# Patient Record
Sex: Male | Born: 1965 | Race: Black or African American | Hispanic: No | Marital: Single | Smoking: Former smoker
Health system: Southern US, Community
[De-identification: ages and names within clinical notes are randomized; demographics above are authoritative.]

---

## 2003-06-11 ENCOUNTER — Emergency Department (HOSPITAL_COMMUNITY): Admission: EM | Admit: 2003-06-11 | Discharge: 2003-06-11 | Payer: Self-pay

## 2012-04-03 ENCOUNTER — Emergency Department (HOSPITAL_COMMUNITY)
Admission: EM | Admit: 2012-04-03 | Discharge: 2012-04-03 | Disposition: A | Discharge status: Home / Self Care | Payer: Self-pay | Attending: Emergency Medicine | Admitting: Emergency Medicine

## 2012-04-03 ENCOUNTER — Encounter (HOSPITAL_COMMUNITY): Payer: Self-pay | Admitting: Emergency Medicine

## 2012-04-03 ENCOUNTER — Emergency Department (HOSPITAL_COMMUNITY): Payer: Self-pay

## 2012-04-03 DIAGNOSIS — M25579 Pain in unspecified ankle and joints of unspecified foot: Secondary | ICD-10-CM | POA: Insufficient documentation

## 2012-04-03 DIAGNOSIS — M79673 Pain in unspecified foot: Secondary | ICD-10-CM

## 2012-04-03 DIAGNOSIS — M7989 Other specified soft tissue disorders: Secondary | ICD-10-CM | POA: Insufficient documentation

## 2012-04-03 MED ORDER — NAPROXEN 500 MG PO TABS: 500.0000 mg | ORAL_TABLET | Freq: Two times a day (BID) | ORAL | Status: DC

## 2012-04-03 MED ORDER — OXYCODONE-ACETAMINOPHEN 5-325 MG PO TABS
1.0000 | ORAL_TABLET | Freq: Once | ORAL | Status: AC
  Administered 2012-04-03: 1 via ORAL
  Filled 2012-04-03: qty 1

## 2012-04-03 MED ORDER — OXYCODONE-ACETAMINOPHEN 5-325 MG PO TABS: 1.0000 | ORAL_TABLET | Freq: Four times a day (QID) | ORAL | Status: AC | PRN

## 2012-04-03 NOTE — ED Notes (Signed)
Pt presenting to ed with c/o left foot pain pt denies injury. Pt states positive swelling. Pt states he woke up yesterday with the pain and swelling and it hasn't stopped.

## 2012-04-03 NOTE — ED Provider Notes (Signed)
History     CSN: [id number]  Arrival date & time 04/03/12  1648   First MD Initiated Contact with Patient 04/03/12 1759      Chief Complaint  Patient presents with  . Foot Pain    (Consider location/radiation/quality/duration/timing/severity/associated sxs/prior treatment) HPI Comments: Patient presents with left foot pain in the area of the MTP joint since yesterday morning. Patient denies injury to the area. He complains of pain and swelling. He denies having swelling like this before. The patient states that he does not have a history of gout. He admits to eating a lot of red meat and drinking alcohol over the previous holiday weekend. No fevers or chills. Patient has been taking Aleve without relief of symptoms. Onset was gradual, course is gradually worsening. Walking or movement of the toe makes the pain worse. Nothing makes symptoms better. Onset was acute, course is gradually worsening. Walking makes the symptoms worse. Nothing makes the symptoms better.  Patient is a 46 y.o. male presenting with lower extremity pain. The history is provided by the patient.  Foot Pain This is a new problem. The current episode started yesterday. The problem occurs constantly. The problem has been gradually worsening. Associated symptoms include arthralgias. Pertinent negatives include no abdominal pain, chest pain, coughing, fever, headaches, myalgias, nausea, rash, sore throat or vomiting. The symptoms are aggravated by walking. He has tried NSAIDs for the symptoms. The treatment provided no relief.    History reviewed. No pertinent past medical history.  History reviewed. No pertinent past surgical history.  No family history on file.  History  Substance Use Topics  . Smoking status: Former Games developer  . Smokeless tobacco: Not on file  . Alcohol Use: 3.6 oz/week    6 Cans of beer per week     daily      Review of Systems  Constitutional: Negative for fever.  HENT: Negative for sore  throat and rhinorrhea.   Eyes: Negative for redness.  Respiratory: Negative for cough.   Cardiovascular: Negative for chest pain.  Gastrointestinal: Negative for nausea, vomiting, abdominal pain and diarrhea.  Musculoskeletal: Positive for arthralgias. Negative for myalgias.  Skin: Positive for color change. Negative for rash and wound.  Neurological: Negative for headaches.  Hematological: Negative for adenopathy.    Allergies  Review of patient's allergies indicates no known allergies.  Home Medications   Current Outpatient Rx  Name Route Sig Dispense Refill  . NAPROXEN SODIUM 220 MG PO TABS Oral Take 400 mg by mouth 2 (two) times daily as needed. pain      BP 103/62  Pulse 86  Temp 98.2 F (36.8 C) (Oral)  Resp 20  SpO2 99%  Physical Exam  Nursing note and vitals reviewed. Constitutional: He appears well-developed and well-nourished.  HENT:  Head: Normocephalic and atraumatic.  Eyes: Conjunctivae are normal.  Neck: Normal range of motion. Neck supple.  Cardiovascular: Normal pulses.   Musculoskeletal: He exhibits tenderness. He exhibits no edema.       Left ankle: Normal.       Left foot: He exhibits decreased range of motion and tenderness.       Feet:       Any motion of MTP of left foot causes significant pain.   Neurological: He is alert. No sensory deficit.       Motor, sensation, and vascular distal to the involved area is fully intact.   Skin: Skin is warm and dry.  Psychiatric: He has a normal mood and affect.  ED Course  Procedures (including critical care time)  Labs Reviewed - No data to display Dg Foot Complete Left  04/03/2012  *RADIOLOGY REPORT*  Clinical Data: Medial foot pain  LEFT FOOT - COMPLETE 3+ VIEW  Comparison: None.  Findings: Small avulsion fracture from the base of the proximal phalanx of the great toe on the lateral aspect has a subacute or chronic appearance.  No definite acute fracture.  Minimal spurring at the posterior calcaneus.   Mild degenerative changes in the midfoot.  IMPRESSION: Chronic changes as described.  Original Report Authenticated By: Donavan Burnet, M.D.     1. Foot pain     6:54 PM Patient seen and examined. Suspect gout, will treat. Crutches by ortho tech.   Vital signs reviewed and are as follows: Filed Vitals:   04/03/12 1723  BP: 103/62  Pulse: 86  Temp: 98.2 F (36.8 C)  Resp: 20   6:54 PM Patient counseled on use of narcotic pain medications. Counseled not to combine these medications with others containing tylenol. Urged not to drink alcohol, drive, or perform any other activities that requires focus while taking these medications. The patient verbalizes understanding and agrees with the plan.    MDM  Pain of MTP, redness overlying. No systemic sx or risk factors for septic joint. Do not suspect overlying cellulitis. Given recent history and exam findings, most likely gout. Urged PCP f/u for confirmation.        Renne Crigler, [place] 04/06/12 1401

## 2012-04-07 NOTE — ED Provider Notes (Signed)
Medical screening examination/treatment/procedure(s) were performed by non-physician practitioner and as supervising physician I was immediately available for consultation/collaboration.    Trae Bovenzi R Ifrah Vest, MD 04/07/12 1109 

## 2012-11-19 ENCOUNTER — Emergency Department (HOSPITAL_COMMUNITY)
Admission: EM | Admit: 2012-11-19 | Discharge: 2012-11-19 | Disposition: A | Discharge status: Home / Self Care | Payer: Self-pay | Attending: Emergency Medicine | Admitting: Emergency Medicine

## 2012-11-19 ENCOUNTER — Emergency Department (HOSPITAL_COMMUNITY): Payer: Self-pay

## 2012-11-19 ENCOUNTER — Encounter (HOSPITAL_COMMUNITY): Payer: Self-pay | Admitting: Emergency Medicine

## 2012-11-19 DIAGNOSIS — W010XXA Fall on same level from slipping, tripping and stumbling without subsequent striking against object, initial encounter: Secondary | ICD-10-CM | POA: Insufficient documentation

## 2012-11-19 DIAGNOSIS — S60229A Contusion of unspecified hand, initial encounter: Secondary | ICD-10-CM | POA: Insufficient documentation

## 2012-11-19 DIAGNOSIS — Z87891 Personal history of nicotine dependence: Secondary | ICD-10-CM | POA: Insufficient documentation

## 2012-11-19 DIAGNOSIS — S62309A Unspecified fracture of unspecified metacarpal bone, initial encounter for closed fracture: Secondary | ICD-10-CM | POA: Insufficient documentation

## 2012-11-19 DIAGNOSIS — Y9289 Other specified places as the place of occurrence of the external cause: Secondary | ICD-10-CM | POA: Insufficient documentation

## 2012-11-19 DIAGNOSIS — Y93K9 Activity, other involving animal care: Secondary | ICD-10-CM | POA: Insufficient documentation

## 2012-11-19 MED ORDER — OXYCODONE-ACETAMINOPHEN 5-325 MG PO TABS
2.0000 | ORAL_TABLET | Freq: Once | ORAL | Status: AC
  Administered 2012-11-19: 2 via ORAL
  Filled 2012-11-19: qty 2

## 2012-11-19 MED ORDER — OXYCODONE-ACETAMINOPHEN 5-325 MG PO TABS
ORAL_TABLET | ORAL | Status: DC
Start: 2012-11-19 — End: 2013-02-11

## 2012-11-19 NOTE — ED Provider Notes (Signed)
History     CSN: [id number]  Arrival date & time 11/19/12  1000   First MD Initiated Contact with Patient 11/19/12 1007      Chief Complaint  Patient presents with  . Hand Injury    (Consider location/radiation/quality/duration/timing/severity/associated sxs/prior treatment) HPI  Jaime [name] is a 47 y.o. male complaining of pain to right hand status post slip and fall 2 days ago. Patient was walking his dog and he slipped on wet tile falling backwards impacting his right hand. There is swelling associated. The pain is severe but only when it is touched. It is on the lateral ulnar side of the hand. He denies any numbness or paresthesia he does endorse a reduced range of motion in digits 5 and 4 secondary to pain. Patient is right-hand dominant.   History reviewed. No pertinent past medical history.  No past surgical history on file.  No family history on file.  History  Substance Use Topics  . Smoking status: Former Games developer  . Smokeless tobacco: Not on file  . Alcohol Use: 3.6 oz/week    6 Cans of beer per week     Comment: daily      Review of Systems  Constitutional: Negative for fever.  Respiratory: Negative for shortness of breath.   Cardiovascular: Negative for chest pain.  Gastrointestinal: Negative for nausea, vomiting, abdominal pain and diarrhea.  Musculoskeletal: Positive for joint swelling and arthralgias.  All other systems reviewed and are negative.    Allergies  Review of patient's allergies indicates no known allergies.  Home Medications   Current Outpatient Rx  Name  Route  Sig  Dispense  Refill  . Phenylephrine-Ibuprofen (ADVIL CONGESTION RELIEF) 10-200 MG TABS   Oral   Take 2 tablets by mouth every 6 (six) hours as needed (for cold).           BP 133/72  Pulse 71  Temp(Src) 97.7 F (36.5 C) (Oral)  Resp 16  SpO2 100%  Physical Exam  Nursing note and vitals reviewed. Constitutional: He is oriented to person, place, and time. He  appears well-developed and well-nourished. No distress.  HENT:  Head: Normocephalic.  Eyes: Conjunctivae and EOM are normal.  Cardiovascular: Normal rate.   Pulmonary/Chest: Effort normal. No stridor.  Musculoskeletal: Normal range of motion.       Hands: Contusion and tenderness to palpation on the palmar side of the right fourth and fifth metacarpal. There is reduced range of motion in both flexion and extension of the fourth and fifth digits however distal sensation is intact and cap refill is less than 2 seconds.  Neurological: He is alert and oriented to person, place, and time.  Psychiatric: He has a normal mood and affect.    ED Course  Procedures (including critical care time)  Labs Reviewed - No data to display Dg Hand Complete Right  11/19/2012  *RADIOLOGY REPORT*  Clinical Data: Swelling at fourth and fifth metacarpals.  Pain. Trauma.  RIGHT HAND - COMPLETE 3+ VIEW  Comparison: None.  Findings: Oblique fractures of the shafts of the fourth and fifth metacarpals.  Mildly displaced.  No intra-articular extension. Overlying soft tissue swelling.  IMPRESSION: Fourth and fifth metacarpal shaft fractures.   Original Report Authenticated By: Jeronimo Greaves, M.D.      1. Metacarpal bone fracture, closed, initial encounter       MDM  Mildly displaced fracture of the right fourth and fifth metacarpals occurring approximately 48 hours ago. Patient is right-hand dominant. He is neurovascularly  intact. Patient will be placed in an ulnar gutter splint, given a sling and encouraged to follow with hand surgery.  Discussed case with attending who agrees with plan and stability to d/c to home.    Pt verbalized understanding and agrees with care plan. Outpatient follow-up and return precautions given.    Discharge Medication List as of 11/19/2012 11:46 AM    START taking these medications   Details  oxyCODONE-acetaminophen (PERCOCET/ROXICET) 5-325 MG per tablet 1 to 2 tabs PO q6hrs  PRN for  pain, Print                 Wynetta Emery, PA-C 11/19/12 1603

## 2012-11-19 NOTE — ED Provider Notes (Signed)
Medical screening examination/treatment/procedure(s) were performed by non-physician practitioner and as supervising physician I was immediately available for consultation/collaboration.   Gwyneth Sprout, MD 11/19/12 [phone]

## 2012-11-19 NOTE — ED Notes (Signed)
Pt states he was walking his dog, slipped and fell, and landed on his right hand 2 days ago.  Pt states that it has been swollen and hurting ever since.  States that it only hurts when you touch it.

## 2013-02-11 ENCOUNTER — Encounter (HOSPITAL_COMMUNITY): Payer: Self-pay | Admitting: *Deleted

## 2013-02-11 ENCOUNTER — Emergency Department (HOSPITAL_COMMUNITY): Payer: Self-pay

## 2013-02-11 ENCOUNTER — Emergency Department (HOSPITAL_COMMUNITY)
Admission: EM | Admit: 2013-02-11 | Discharge: 2013-02-11 | Disposition: A | Discharge status: Home / Self Care | Payer: Self-pay | Attending: Emergency Medicine | Admitting: Emergency Medicine

## 2013-02-11 DIAGNOSIS — S8990XA Unspecified injury of unspecified lower leg, initial encounter: Secondary | ICD-10-CM | POA: Insufficient documentation

## 2013-02-11 DIAGNOSIS — Z87891 Personal history of nicotine dependence: Secondary | ICD-10-CM | POA: Insufficient documentation

## 2013-02-11 DIAGNOSIS — S99922A Unspecified injury of left foot, initial encounter: Secondary | ICD-10-CM

## 2013-02-11 DIAGNOSIS — Y9389 Activity, other specified: Secondary | ICD-10-CM | POA: Insufficient documentation

## 2013-02-11 DIAGNOSIS — Y929 Unspecified place or not applicable: Secondary | ICD-10-CM | POA: Insufficient documentation

## 2013-02-11 DIAGNOSIS — IMO0002 Reserved for concepts with insufficient information to code with codable children: Secondary | ICD-10-CM | POA: Insufficient documentation

## 2013-02-11 DIAGNOSIS — S99929A Unspecified injury of unspecified foot, initial encounter: Secondary | ICD-10-CM | POA: Insufficient documentation

## 2013-02-11 MED ORDER — HYDROCODONE-ACETAMINOPHEN 5-325 MG PO TABS: 1.0000 | ORAL_TABLET | Freq: Four times a day (QID) | ORAL | Status: DC | PRN

## 2013-02-11 MED ORDER — IBUPROFEN 800 MG PO TABS: 800.0000 mg | ORAL_TABLET | Freq: Three times a day (TID) | ORAL | Status: DC

## 2013-02-11 NOTE — ED Notes (Signed)
Pt discharged.Vital signs stable and GCS 15 

## 2013-02-11 NOTE — ED Notes (Signed)
Pt reports hitting left big toe on something two days ago and still having pain and swelling .ambulatory at triage.

## 2013-02-11 NOTE — ED Provider Notes (Signed)
History    This chart was scribed for Jaime [name], non-physician practitioner working with Carleene Cooper III, MD by Leone Payor, ED Scribe. This patient was seen in room TR07C/TR07C and the patient's care was started at 1826.   CSN: [id number]  Arrival date & time 02/11/13  1826   First MD Initiated Contact with Patient 02/11/13 1837      Chief Complaint  Patient presents with  . Toe Pain     The history is provided by the patient. No language interpreter was used.    HPI Comments: Jaime [name] is a 47 y.o. male who presents to the Emergency Department complaining of constant, gradually worsening L great toe pain starting 2 days ago. Pt states he stubbed it on something 2 days ago but he began to feel increasing pain and swelling yesterday. He denies fever. States does recall hx of similar pain, was told it may be gout. No other injuries. Took ibuprofen with mild relief.     History reviewed. No pertinent past medical history.  History reviewed. No pertinent past surgical history.  History reviewed. No pertinent family history.  History  Substance Use Topics  . Smoking status: Former Games developer  . Smokeless tobacco: Not on file  . Alcohol Use: 3.6 oz/week    6 Cans of beer per week     Comment: daily      Review of Systems  Musculoskeletal: Positive for joint swelling and arthralgias.    Allergies  Review of patient's allergies indicates no known allergies.  Home Medications   Current Outpatient Rx  Name  Route  Sig  Dispense  Refill  . diphenhydrAMINE (BENADRYL) 25 mg capsule   Oral   Take 25 mg by mouth daily as needed for allergies.         Marland Kitchen ibuprofen (ADVIL,MOTRIN) 200 MG tablet   Oral   Take 400 mg by mouth every 6 (six) hours as needed for pain.           BP 134/76  Pulse 72  Temp(Src) 97.9 F (36.6 C) (Oral)  Resp 18  SpO2 99%  Physical Exam  Nursing note and vitals reviewed. Constitutional: He is oriented to person, place, and  time. He appears well-developed and well-nourished. No distress.  HENT:  Head: Normocephalic and atraumatic.  Eyes: EOM are normal.  Neck: Neck supple. No tracheal deviation present.  Cardiovascular: Normal rate.   Pulmonary/Chest: Effort normal. No respiratory distress.  Musculoskeletal: Normal range of motion.  Swelling over L great toe and first MTP joint over palmer surface of 1st metatarsal. Tender to palpation over first toe, MTP joint and metatarsal. Pain with ROM at both MTP and IP joint of L toe.   Neurological: He is alert and oriented to person, place, and time.  Skin: Skin is warm and dry.  Psychiatric: He has a normal mood and affect. His behavior is normal.    ED Course  Procedures (including critical care time)  DIAGNOSTIC STUDIES: Oxygen Saturation is 99% on room air, normal by my interpretation.    COORDINATION OF CARE: 6:53 PM Discussed treatment plan with pt at bedside and pt agreed to plan.   Labs Reviewed - No data to display Dg Foot Complete Left  02/11/2013   *RADIOLOGY REPORT*  Clinical Data: Kicked wall. Medial left foot and toe pain.  LEFT FOOT - COMPLETE 3+ VIEW  Comparison: 04/03/2012  Findings: No evidence of acute fracture or dislocation.  Tiny dorsal calcaneal spur again noted.  No  significant change compared to prior exam.  IMPRESSION: No acute findings.   Original Report Authenticated By: Myles Rosenthal, M.D.     1. Foot injury, left, initial encounter       MDM  Pt with swelling and tenderness over left MTP joint of the great toe. Xray negative. Exam most consistent with possible gout vs traumatic joint pain. He has no fever, asymptomatic otherwise, no other complaints. Doubt septic joint. Will treat with NSAIDs, norco, follow up. Given precautions to return if redness or worsening swelling, fever, malaise. Pt voiced understanding.    Filed Vitals:   02/11/13 1831  BP: 134/76  Pulse: 72  Temp: 97.9 F (36.6 C)  TempSrc: Oral  Resp: 18  SpO2:  99%    I personally performed the services described in this documentation, which was scribed in my presence. The recorded information has been reviewed and is accurate.   Lottie Mussel, PA-C 02/12/13 0013

## 2013-02-12 NOTE — ED Provider Notes (Signed)
Medical screening examination/treatment/procedure(s) were performed by non-physician practitioner and as supervising physician I was immediately available for consultation/collaboration.   Carleene Cooper III, MD 02/12/13 [phone]

## 2013-12-13 ENCOUNTER — Encounter (HOSPITAL_COMMUNITY): Payer: Self-pay | Admitting: Emergency Medicine

## 2013-12-13 ENCOUNTER — Emergency Department (HOSPITAL_COMMUNITY): Payer: Self-pay

## 2013-12-13 ENCOUNTER — Emergency Department (HOSPITAL_COMMUNITY)
Admission: EM | Admit: 2013-12-13 | Discharge: 2013-12-13 | Disposition: A | Discharge status: Home / Self Care | Payer: Self-pay | Attending: Emergency Medicine | Admitting: Emergency Medicine

## 2013-12-13 DIAGNOSIS — W2203XA Walked into furniture, initial encounter: Secondary | ICD-10-CM | POA: Insufficient documentation

## 2013-12-13 DIAGNOSIS — Y9301 Activity, walking, marching and hiking: Secondary | ICD-10-CM | POA: Insufficient documentation

## 2013-12-13 DIAGNOSIS — Z87891 Personal history of nicotine dependence: Secondary | ICD-10-CM | POA: Insufficient documentation

## 2013-12-13 DIAGNOSIS — S92919A Unspecified fracture of unspecified toe(s), initial encounter for closed fracture: Secondary | ICD-10-CM | POA: Insufficient documentation

## 2013-12-13 DIAGNOSIS — S92911A Unspecified fracture of right toe(s), initial encounter for closed fracture: Secondary | ICD-10-CM

## 2013-12-13 DIAGNOSIS — Y92009 Unspecified place in unspecified non-institutional (private) residence as the place of occurrence of the external cause: Secondary | ICD-10-CM | POA: Insufficient documentation

## 2013-12-13 MED ORDER — HYDROCODONE-ACETAMINOPHEN 5-325 MG PO TABS: 1.0000 | ORAL_TABLET | Freq: Four times a day (QID) | ORAL | Status: DC | PRN

## 2013-12-13 MED ORDER — IBUPROFEN 400 MG PO TABS
800.0000 mg | ORAL_TABLET | Freq: Once | ORAL | Status: AC
  Administered 2013-12-13: 800 mg via ORAL
  Filled 2013-12-13: qty 4

## 2013-12-13 NOTE — ED Notes (Signed)
Ortho tech paged for XL post-op shoe.

## 2013-12-13 NOTE — ED Provider Notes (Signed)
CSN: [id number]     Arrival date & time 12/13/13  0011 History   First MD Initiated Contact with Patient 12/13/13 0034     Chief Complaint  Patient presents with  . Toe Injury     (Consider location/radiation/quality/duration/timing/severity/associated sxs/prior Treatment) Patient is a 48 y.o. male presenting with toe pain. The history is provided by the patient. No language interpreter was used.  Toe Pain This is a new problem. The current episode started today. Associated symptoms include arthralgias and joint swelling. Pertinent negatives include no nausea, numbness, vomiting or weakness.  Pt is a 48 year old male who reports that he hit his toe on a new piece of furniture while walking through the living room. He reports swelling and tenderness to his 4th, left toe. No numbness or tingling. Ambulatory without any difficulties.   History reviewed. No pertinent past medical history. History reviewed. No pertinent past surgical history. No family history on file. History  Substance Use Topics  . Smoking status: Former Games developer  . Smokeless tobacco: Not on file  . Alcohol Use: 3.6 oz/week    6 Cans of beer per week     Comment: daily    Review of Systems  Gastrointestinal: Negative for nausea and vomiting.  Musculoskeletal: Positive for arthralgias and joint swelling. Negative for gait problem.  Neurological: Negative for weakness and numbness.  All other systems reviewed and are negative.      Allergies  Review of patient's allergies indicates no known allergies.  Home Medications   Current Outpatient Rx  Name  Route  Sig  Dispense  Refill  . diphenhydrAMINE (BENADRYL) 25 mg capsule   Oral   Take 25 mg by mouth daily as needed for allergies.         Marland Kitchen HYDROcodone-acetaminophen (NORCO) 5-325 MG per tablet   Oral   Take 1 tablet by mouth every 6 (six) hours as needed for pain.   20 tablet   0   . ibuprofen (ADVIL,MOTRIN) 200 MG tablet   Oral   Take 400 mg by  mouth every 6 (six) hours as needed for pain.         Marland Kitchen ibuprofen (ADVIL,MOTRIN) 800 MG tablet   Oral   Take 1 tablet (800 mg total) by mouth 3 (three) times daily.   21 tablet   0    BP 142/87  Pulse 71  Temp(Src) 97.8 F (36.6 C) (Oral)  Resp 14  Ht 6\' 2"  (1.88 m)  Wt 245 lb (111.131 kg)  BMI 31.44 kg/m2  SpO2 97% Physical Exam  Nursing note and vitals reviewed. Constitutional: He is oriented to person, place, and time. He appears well-developed and well-nourished. No distress.  Eyes: Conjunctivae and EOM are normal.  Cardiovascular: Normal rate, regular rhythm, normal heart sounds and intact distal pulses.   Pulmonary/Chest: Effort normal and breath sounds normal. No respiratory distress. He has no wheezes.  Musculoskeletal: Normal range of motion. He exhibits edema and tenderness.  Right 4th toe edema and TTP. No numbness or tingling. Limited ROM, painful. Slight limp on right foot when walking.   Neurological: He is alert and oriented to person, place, and time.  Skin: Skin is warm and dry.  Psychiatric: He has a normal mood and affect. His behavior is normal. Judgment and thought content normal.    ED Course  Procedures (including critical care time) Labs Review Labs Reviewed - No data to display Imaging Review No results found.   EKG Interpretation None  MDM   Final diagnoses:  Toe fracture, right     Left foot x-ray, non-displaced fracture of left fourth toe. Toes buddy taped and ortho shoe given. Discussed plan of care and pt agrees.     Irish EldersKelly Ac Colan, NP 12/20/13 1836

## 2013-12-13 NOTE — Discharge Instructions (Signed)
Buddy Taping of Toes We have taped your toes together to keep them from moving. This is called "buddy taping" since we used a part of your own body to keep the injured part still. We placed soft padding between your toes to keep them from rubbing against each other. Buddy taping will help with healing and to reduce pain. Keep your toes buddy taped together for as long as directed by your caregiver. HOME CARE INSTRUCTIONS   Raise your injured area above the level of your heart while sitting or lying down. Prop it up with pillows.  An ice pack used every twenty minutes, while awake, for the first one to two days may be helpful. Put ice in a plastic bag and put a towel between the bag and your skin.  Watch for signs that the taping is too tight. These signs may be:  Numbness of your taped toes.  Coolness of your taped toes.  Color change in the area beyond the tape.  Increased pain.  If you have any of these signs, loosen or rewrap the tape. If you need to loosen or rewrap the buddy tape, make sure you use the padding again. SEEK IMMEDIATE MEDICAL CARE IF:   You have worse pain, swelling, inflammation (soreness), drainage or bleeding after you rewrap the tape.  Any new problems occur. MAKE SURE YOU:   Understand these instructions.  Will watch your condition.  Will get help right away if you are not doing well or get worse. Document Released: 06/17/2004 Document Revised: 12/06/2011 Document Reviewed: 09/10/2008 [facility] Patient Information 2014 [place], [place]. RICE: Routine Care for Injuries The routine care of many injuries includes Rest, Ice, Compression, and Elevation (RICE). HOME CARE INSTRUCTIONS  Rest is needed to allow your body to heal. Routine activities can usually be resumed when comfortable. Injured tendons and bones can take up to 6 weeks to heal. Tendons are the cord-like structures that attach muscle to bone.  Ice following an injury helps keep the swelling down  and reduces pain.  Put ice in a plastic bag.  Place a towel between your skin and the bag.  Leave the ice on for 15-20 minutes, 03-04 times a day. Do this while awake, for the first 24 to 48 hours. After that, continue as directed by your caregiver.  Compression helps keep swelling down. It also gives support and helps with discomfort. If an elastic bandage has been applied, it should be removed and reapplied every 3 to 4 hours. It should not be applied tightly, but firmly enough to keep swelling down. Watch fingers or toes for swelling, bluish discoloration, coldness, numbness, or excessive pain. If any of these problems occur, remove the bandage and reapply loosely. Contact your caregiver if these problems continue.  Elevation helps reduce swelling and decreases pain. With extremities, such as the arms, hands, legs, and feet, the injured area should be placed near or above the level of the heart, if possible. SEEK IMMEDIATE MEDICAL CARE IF:  You have persistent pain and swelling.  You develop redness, numbness, or unexpected weakness.  Your symptoms are getting worse rather than improving after several days. These symptoms may indicate that further evaluation or further X-rays are needed. Sometimes, X-rays may not show a small broken bone (fracture) until 1 week or 10 days later. Make a follow-up appointment with your caregiver. Ask when your X-ray results will be ready. Make sure you get your X-ray results. Document Released: 12/26/2000 Document Revised: 12/06/2011 Document Reviewed: 02/12/2011 [employer]  Patient Information 2014 [employer] [place].   Hydrocodone for severe pain Ibuprofen for mild-moderate pain Elevate and apply ice to toe Wear orthotic shoe Buddy tape toes together

## 2013-12-13 NOTE — ED Notes (Addendum)
Pt reports he banged his toe on his furniture about thirty minutes ago, pt's fourth left toe is swollen and tender to the touch, pt report numbness to his injured toe and top of his left foot. Pt is A&O X4. Pt states he is unable to bare weight on his left foot because he has too much pain.

## 2013-12-13 NOTE — ED Notes (Signed)
Patient transported to X-ray 

## 2013-12-20 NOTE — ED Provider Notes (Signed)
Medical screening examination/treatment/procedure(s) were performed by non-physician practitioner and as supervising physician I was immediately available for consultation/collaboration.   Dione Boozeavid Breann Losano, MD 12/20/13 2300

## 2015-05-05 ENCOUNTER — Emergency Department (HOSPITAL_COMMUNITY)
Admission: EM | Admit: 2015-05-05 | Discharge: 2015-05-05 | Disposition: A | Discharge status: Home / Self Care | Payer: Self-pay | Attending: Emergency Medicine | Admitting: Emergency Medicine

## 2015-05-05 ENCOUNTER — Emergency Department (HOSPITAL_COMMUNITY): Payer: Self-pay

## 2015-05-05 ENCOUNTER — Encounter (HOSPITAL_COMMUNITY): Payer: Self-pay | Admitting: Emergency Medicine

## 2015-05-05 DIAGNOSIS — W228XXA Striking against or struck by other objects, initial encounter: Secondary | ICD-10-CM | POA: Insufficient documentation

## 2015-05-05 DIAGNOSIS — Z79899 Other long term (current) drug therapy: Secondary | ICD-10-CM | POA: Insufficient documentation

## 2015-05-05 DIAGNOSIS — S2241XA Multiple fractures of ribs, right side, initial encounter for closed fracture: Secondary | ICD-10-CM | POA: Insufficient documentation

## 2015-05-05 DIAGNOSIS — Y998 Other external cause status: Secondary | ICD-10-CM | POA: Insufficient documentation

## 2015-05-05 DIAGNOSIS — Y9289 Other specified places as the place of occurrence of the external cause: Secondary | ICD-10-CM | POA: Insufficient documentation

## 2015-05-05 DIAGNOSIS — Y9389 Activity, other specified: Secondary | ICD-10-CM | POA: Insufficient documentation

## 2015-05-05 DIAGNOSIS — Z87891 Personal history of nicotine dependence: Secondary | ICD-10-CM | POA: Insufficient documentation

## 2015-05-05 MED ORDER — TRAMADOL HCL 50 MG PO TABS
50.0000 mg | ORAL_TABLET | Freq: Once | ORAL | Status: AC
  Administered 2015-05-05: 50 mg via ORAL
  Filled 2015-05-05: qty 1

## 2015-05-05 MED ORDER — CYCLOBENZAPRINE HCL 10 MG PO TABS
5.0000 mg | ORAL_TABLET | Freq: Once | ORAL | Status: AC
  Administered 2015-05-05: 5 mg via ORAL
  Filled 2015-05-05: qty 1

## 2015-05-05 MED ORDER — CYCLOBENZAPRINE HCL 10 MG PO TABS: 10.0000 mg | ORAL_TABLET | Freq: Two times a day (BID) | ORAL | Status: DC | PRN

## 2015-05-05 MED ORDER — NAPROXEN 500 MG PO TABS: 500.0000 mg | ORAL_TABLET | Freq: Two times a day (BID) | ORAL | Status: DC

## 2015-05-05 NOTE — ED Notes (Signed)
Pt. Stated, i injured my eright rib area 2 weeks ago in a truck door.

## 2015-05-05 NOTE — Discharge Instructions (Signed)

## 2015-05-05 NOTE — ED Provider Notes (Signed)
CSN: [id number]     Arrival date & time 05/05/15  1519 History  This chart was scribed for non-physician practitioner, Dorena Dew. Neva Seat, PA-C working with Mancel Bale, MD by Gwenyth Ober, ED scribe. This patient was seen in room TR03C/TR03C and the patient's care was started at 4:22 PM    Chief Complaint  Patient presents with  . Rib Injury   The history is provided by the patient. No language interpreter was used.    HPI Comments: Jaime [name] is a 49 y.o. male with no chronic medical history who presents to the Emergency Department complaining of constant, moderate right rib pain that started 2 weeks ago. Pt notes some movement and a pulsating sensation in right ribs when he breaths deeply. He has tried icing the affected area with no relief. Pt reports onset of pain occurred when he was leaning on the cab of his truck, bending to reach something, and his rib was caught in the truck. He further aggravated his symptoms while lifting furniture within the last few days. Pt denies difficulty breathing, sleeping or moving. No chest pain, diaphoresis, nausea, vomiting.   History reviewed. No pertinent past medical history. History reviewed. No pertinent past surgical history. No family history on file. History  Substance Use Topics  . Smoking status: Former Games developer  . Smokeless tobacco: Not on file  . Alcohol Use: 3.6 oz/week    6 Cans of beer per week     Comment: daily    Review of Systems  Respiratory: Negative for shortness of breath.   Musculoskeletal: Positive for arthralgias.  All other systems reviewed and are negative.  Allergies  Review of patient's allergies indicates no known allergies.  Home Medications   Prior to Admission medications   Medication Sig Start Date End Date Taking? Authorizing Provider  cyclobenzaprine (FLEXERIL) 10 MG tablet Take 1 tablet (10 mg total) by mouth 2 (two) times daily as needed for muscle spasms. 05/05/15   Elienai Gailey Neva Seat, PA-C   diphenhydrAMINE (BENADRYL) 25 mg capsule Take 25 mg by mouth daily as needed for allergies.    Historical Provider, MD  HYDROcodone-acetaminophen (NORCO) 5-325 MG per tablet Take 1 tablet by mouth every 6 (six) hours as needed for pain. 02/11/13   Tatyana Kirichenko, PA-C  HYDROcodone-acetaminophen (NORCO/VICODIN) 5-325 MG per tablet Take 1-2 tablets by mouth every 6 (six) hours as needed. 12/13/13   Irish Elders, NP  ibuprofen (ADVIL,MOTRIN) 200 MG tablet Take 400 mg by mouth every 6 (six) hours as needed for pain.    Historical Provider, MD  ibuprofen (ADVIL,MOTRIN) 800 MG tablet Take 1 tablet (800 mg total) by mouth 3 (three) times daily. 02/11/13   Tatyana Kirichenko, PA-C  naproxen (NAPROSYN) 500 MG tablet Take 1 tablet (500 mg total) by mouth 2 (two) times daily. 05/05/15   Taryn Nave Neva Seat, PA-C   BP 108/70 mmHg  Pulse 83  Temp(Src) 98.3 F (36.8 C) (Oral)  Resp 17  Ht 6\' 2"  (1.88 m)  Wt 238 lb (107.956 kg)  BMI 30.54 kg/m2  SpO2 99% Physical Exam  Constitutional: He appears well-developed and well-nourished. No distress.  HENT:  Head: Normocephalic and atraumatic.  Eyes: Conjunctivae and EOM are normal.  Neck: Neck supple. No tracheal deviation present.  Cardiovascular: Normal rate.   Pulmonary/Chest: Effort normal. No respiratory distress. He has no decreased breath sounds. He has no wheezes. He has no rhonchi. He exhibits tenderness, bony tenderness, edema and swelling. He exhibits no crepitus, no deformity and no retraction.  Skin: Skin is warm and dry.  Psychiatric: He has a normal mood and affect. His behavior is normal.  Nursing note and vitals reviewed.   ED Course  Procedures   DIAGNOSTIC STUDIES: Oxygen Saturation is 99% on RA, normal by my interpretation.    COORDINATION OF CARE: 4:27 PM Discussed treatment plan with pt which includes an x-ray of his right rib. Pt agreed to plan.   Labs Review Labs Reviewed - No data to display  Imaging Review Dg Ribs  Unilateral W/chest Right  05/05/2015   CLINICAL DATA:  49 year old male with right lower anterior rib injury 2 weeks ago. Pain. Initial encounter.  EXAM: RIGHT RIBS AND CHEST - 3+ VIEW  COMPARISON:  None.  FINDINGS: Fracture of the anterior lateral aspect of the right eighth rib.  No pneumothorax.  Minimal curvature of the thoracic spine convex to the right.  Heart size within normal limits.  IMPRESSION: Anterior lateral right eighth rib fracture.  No pneumothorax.  Mild scoliosis thoracic spine convex to the right.   Electronically Signed   By: Lacy Duverney M.D.   On: 05/05/2015 16:45     EKG Interpretation None      MDM   Final diagnoses:  Fracture of eight ribs or more, closed, right, initial encounter    Pt given Peak Flow and advised how to use this and how often. Given rx for muscle relaxer and NSAIDs F/u with ortho. No SOB, difficulty breathing, orthopnea.  Medications  cyclobenzaprine (FLEXERIL) tablet 5 mg (not administered)  traMADol (ULTRAM) tablet 50 mg (not administered)    49 y.o.Jaime [name]'s evaluation in the Emergency Department is complete. It has been determined that no acute conditions requiring further emergency intervention are present at this time. The patient/guardian have been advised of the diagnosis and plan. We have discussed signs and symptoms that warrant return to the ED, such as changes or worsening in symptoms.  Vital signs are stable at discharge. Filed Vitals:   05/05/15 1534  BP: 108/70  Pulse: 83  Temp: 98.3 F (36.8 C)  Resp: 17    Patient/guardian has voiced understanding and agreed to follow-up with the PCP or specialist.   I personally performed the services described in this documentation, which was scribed in my presence. The recorded information has been reviewed and is accurate.    Marlon Pel, PA-C 05/05/15 1709  Mancel Bale, MD 05/07/15 [phone]

## 2016-01-26 ENCOUNTER — Emergency Department (HOSPITAL_COMMUNITY)
Admission: EM | Admit: 2016-01-26 | Discharge: 2016-01-26 | Disposition: A | Discharge status: Home / Self Care | Payer: Worker's Compensation | Attending: Emergency Medicine | Admitting: Emergency Medicine

## 2016-01-26 ENCOUNTER — Encounter (HOSPITAL_COMMUNITY): Payer: Self-pay | Admitting: *Deleted

## 2016-01-26 DIAGNOSIS — M545 Low back pain, unspecified: Secondary | ICD-10-CM

## 2016-01-26 DIAGNOSIS — Z791 Long term (current) use of non-steroidal anti-inflammatories (NSAID): Secondary | ICD-10-CM | POA: Diagnosis not present

## 2016-01-26 DIAGNOSIS — Y9389 Activity, other specified: Secondary | ICD-10-CM | POA: Insufficient documentation

## 2016-01-26 DIAGNOSIS — Y998 Other external cause status: Secondary | ICD-10-CM | POA: Insufficient documentation

## 2016-01-26 DIAGNOSIS — S3992XA Unspecified injury of lower back, initial encounter: Secondary | ICD-10-CM | POA: Diagnosis not present

## 2016-01-26 DIAGNOSIS — Y92410 Unspecified street and highway as the place of occurrence of the external cause: Secondary | ICD-10-CM | POA: Diagnosis not present

## 2016-01-26 DIAGNOSIS — S199XXA Unspecified injury of neck, initial encounter: Secondary | ICD-10-CM | POA: Diagnosis present

## 2016-01-26 DIAGNOSIS — M542 Cervicalgia: Secondary | ICD-10-CM

## 2016-01-26 DIAGNOSIS — Z87891 Personal history of nicotine dependence: Secondary | ICD-10-CM | POA: Diagnosis not present

## 2016-01-26 MED ORDER — KETOROLAC TROMETHAMINE 60 MG/2ML IM SOLN
60.0000 mg | Freq: Once | INTRAMUSCULAR | Status: AC
  Administered 2016-01-26: 60 mg via INTRAMUSCULAR
  Filled 2016-01-26: qty 2

## 2016-01-26 MED ORDER — METHOCARBAMOL 500 MG PO TABS: 500.0000 mg | ORAL_TABLET | Freq: Two times a day (BID) | ORAL | Status: DC

## 2016-01-26 MED ORDER — IBUPROFEN 800 MG PO TABS: 800.0000 mg | ORAL_TABLET | Freq: Three times a day (TID) | ORAL | Status: DC
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
## 2016-02-03 ENCOUNTER — Emergency Department (HOSPITAL_COMMUNITY)
Admission: EM | Admit: 2016-02-03 | Discharge: 2016-02-03 | Disposition: A | Discharge status: Home / Self Care | Payer: Worker's Compensation | Attending: Emergency Medicine | Admitting: Emergency Medicine

## 2016-02-03 ENCOUNTER — Emergency Department (HOSPITAL_COMMUNITY): Payer: Worker's Compensation

## 2016-02-03 ENCOUNTER — Encounter (HOSPITAL_COMMUNITY): Payer: Self-pay

## 2016-02-03 DIAGNOSIS — Y9389 Activity, other specified: Secondary | ICD-10-CM | POA: Diagnosis not present

## 2016-02-03 DIAGNOSIS — F0781 Postconcussional syndrome: Secondary | ICD-10-CM | POA: Diagnosis not present

## 2016-02-03 DIAGNOSIS — Y92410 Unspecified street and highway as the place of occurrence of the external cause: Secondary | ICD-10-CM | POA: Insufficient documentation

## 2016-02-03 DIAGNOSIS — S39012A Strain of muscle, fascia and tendon of lower back, initial encounter: Secondary | ICD-10-CM | POA: Diagnosis not present

## 2016-02-03 DIAGNOSIS — Z87891 Personal history of nicotine dependence: Secondary | ICD-10-CM | POA: Insufficient documentation

## 2016-02-03 DIAGNOSIS — Z79899 Other long term (current) drug therapy: Secondary | ICD-10-CM | POA: Insufficient documentation

## 2016-02-03 DIAGNOSIS — S161XXA Strain of muscle, fascia and tendon at neck level, initial encounter: Secondary | ICD-10-CM | POA: Insufficient documentation

## 2016-02-03 DIAGNOSIS — Y998 Other external cause status: Secondary | ICD-10-CM | POA: Diagnosis not present

## 2016-02-03 DIAGNOSIS — S3991XA Unspecified injury of abdomen, initial encounter: Secondary | ICD-10-CM | POA: Insufficient documentation

## 2016-02-03 DIAGNOSIS — S199XXA Unspecified injury of neck, initial encounter: Secondary | ICD-10-CM | POA: Diagnosis present

## 2016-02-03 MED ORDER — DEXAMETHASONE SODIUM PHOSPHATE 10 MG/ML IJ SOLN
10.0000 mg | Freq: Once | INTRAMUSCULAR | Status: AC
  Administered 2016-02-03: 10 mg via INTRAVENOUS
  Filled 2016-02-03: qty 1

## 2016-02-03 MED ORDER — KETOROLAC TROMETHAMINE 30 MG/ML IJ SOLN: 30.0000 mg | Freq: Once | INTRAMUSCULAR | Status: DC

## 2016-02-03 MED ORDER — KETOROLAC TROMETHAMINE 30 MG/ML IJ SOLN
30.0000 mg | Freq: Once | INTRAMUSCULAR | Status: AC
  Administered 2016-02-03: 30 mg via INTRAVENOUS
  Filled 2016-02-03: qty 1

## 2016-02-03 MED ORDER — HYDROCODONE-ACETAMINOPHEN 5-325 MG PO TABS: 1.0000 | ORAL_TABLET | ORAL | Status: DC | PRN

## 2016-02-03 MED ORDER — PREDNISONE 10 MG (21) PO TBPK: 10.0000 mg | ORAL_TABLET | Freq: Every day | ORAL | Status: DC

## 2016-02-03 MED ORDER — DEXAMETHASONE SODIUM PHOSPHATE 10 MG/ML IJ SOLN: 10.0000 mg | Freq: Once | INTRAMUSCULAR | Status: DC

## 2016-02-03 NOTE — ED Notes (Signed)
Pt is in stable condition upon d/c and ambulates from ED. 
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
## 2019-03-23 ENCOUNTER — Other Ambulatory Visit: Payer: Self-pay

## 2019-03-23 ENCOUNTER — Encounter (HOSPITAL_COMMUNITY): Payer: Self-pay | Admitting: Emergency Medicine

## 2019-03-23 ENCOUNTER — Emergency Department (HOSPITAL_COMMUNITY)
Admission: EM | Admit: 2019-03-23 | Discharge: 2019-03-23 | Disposition: A | Discharge status: Home / Self Care | Payer: Self-pay | Attending: Emergency Medicine | Admitting: Emergency Medicine

## 2019-03-23 ENCOUNTER — Emergency Department (HOSPITAL_COMMUNITY): Payer: Self-pay

## 2019-03-23 DIAGNOSIS — S43402A Unspecified sprain of left shoulder joint, initial encounter: Secondary | ICD-10-CM

## 2019-03-23 DIAGNOSIS — Z87891 Personal history of nicotine dependence: Secondary | ICD-10-CM | POA: Insufficient documentation

## 2019-03-23 DIAGNOSIS — M25512 Pain in left shoulder: Secondary | ICD-10-CM | POA: Insufficient documentation

## 2019-03-23 MED ORDER — NAPROXEN 500 MG PO TABS: 500.0000 mg | ORAL_TABLET | Freq: Two times a day (BID) | ORAL | 0 refills | Status: AC

## 2019-03-23 MED ORDER — METHOCARBAMOL 500 MG PO TABS: 1000.0000 mg | ORAL_TABLET | Freq: Four times a day (QID) | ORAL | 0 refills | Status: AC

## 2019-03-23 MED ORDER — IBUPROFEN 400 MG PO TABS
600.0000 mg | ORAL_TABLET | Freq: Once | ORAL | Status: AC
  Administered 2019-03-23: 600 mg via ORAL
  Filled 2019-03-23: qty 1

## 2019-03-23 MED ORDER — KETOROLAC TROMETHAMINE 30 MG/ML IJ SOLN: 15.0000 mg | Freq: Once | INTRAMUSCULAR | Status: DC

## 2019-03-23 NOTE — ED Notes (Signed)
Patient transported to X-ray 

## 2019-03-23 NOTE — ED Provider Notes (Signed)
[facility] Provider Note   CSN: [id number] Arrival date & time: 03/23/19  1421    History   Chief Complaint Chief Complaint  Patient presents with  . Shoulder Pain    HPI Jaime [name] is a 53 y.o. male who is previously healthy who presents with left shoulder pain after he fell 2 days ago.  Patient reports he was putting down a garage door when it hit him in the head and he fell on his left shoulder.  He has been unable to move it very much since.  He denies any numbness or tingling.  He is not taking any medications at home for symptoms.  He reports he has a little knot on his head, but denies any headache or loss of consciousness.  He denies any other injuries.  He denies any chest pain or shortness of breath.     HPI  History reviewed. No pertinent past medical history.  There are no active problems to display for this patient.   History reviewed. No pertinent surgical history.      Home Medications    Prior to Admission medications   Medication Sig Start Date End Date Taking? Authorizing Provider  diphenhydrAMINE (BENADRYL) 25 mg capsule Take 25 mg by mouth daily as needed for allergies.    [provider]  HYDROcodone-acetaminophen (NORCO/VICODIN) 5-325 MG tablet Take 1 tablet by mouth every 4 (four) hours as needed. 02/03/16   Jacalyn LefevreHaviland, Julie, MD  methocarbamol (ROBAXIN) 500 MG tablet Take 1 tablet (500 mg total) by mouth 2 (two) times daily. 01/26/16   Barrett, Rolm GalaStevi, PA-C  predniSONE (STERAPRED UNI-PAK 21 TAB) 10 MG (21) TBPK tablet Take 1 tablet (10 mg total) by mouth daily. Take 6 tabs by mouth daily  for 2 days, then 5 tabs for 2 days, then 4 tabs for 2 days, then 3 tabs for 2 days, 2 tabs for 2 days, then 1 tab by mouth daily for 2 days 02/03/16   Jacalyn LefevreHaviland, Julie, MD    Family History No family history on file.  Social History Social History   Tobacco Use  . Smoking status: Former Games developermoker  . Smokeless tobacco:  Never Used  Substance Use Topics  . Alcohol use: Not Currently    Alcohol/week: 6.0 standard drinks    Types: 6 Cans of beer per week    Comment: daily  . Drug use: No     Allergies   Patient has no known allergies.   Review of Systems Review of Systems  Constitutional: Negative for fever.  Respiratory: Negative for shortness of breath.   Cardiovascular: Negative for chest pain.  Musculoskeletal: Positive for arthralgias.  Neurological: Negative for syncope and headaches.     Physical Exam Updated Vital Signs BP 138/79 (BP Location: Right Arm)   Pulse 88   Temp 97.8 F (36.6 C) (Oral)   Resp 14   Ht 6\' 2"  (1.88 m)   Wt 108 kg   SpO2 98%   BMI 30.56 kg/m   Physical Exam Vitals signs and nursing note reviewed.  Constitutional:      General: He is not in acute distress.    Appearance: He is well-developed. He is not diaphoretic.  HENT:     Head: Normocephalic and atraumatic.     Mouth/Throat:     Pharynx: No oropharyngeal exudate.  Eyes:     General: No scleral icterus.       Right eye: No discharge.  Left eye: No discharge.     Conjunctiva/sclera: Conjunctivae normal.     Pupils: Pupils are equal, round, and reactive to light.  Neck:     Musculoskeletal: Normal range of motion and neck supple.     Thyroid: No thyromegaly.  Cardiovascular:     Rate and Rhythm: Normal rate and regular rhythm.     Heart sounds: Normal heart sounds. No murmur. No friction rub. No gallop.   Pulmonary:     Effort: Pulmonary effort is normal. No respiratory distress.     Breath sounds: Normal breath sounds. No stridor. No wheezing or rales.  Abdominal:     General: Bowel sounds are normal. There is no distension.     Palpations: Abdomen is soft.     Tenderness: There is no abdominal tenderness. There is no guarding or rebound.  Musculoskeletal:     Comments: Very limited range of motion of the left shoulder with anterior and posterior tenderness around the rotator cuff  and some bony tenderness anterior and posterior; limited passive range of motion as well; no significant edema, erythema or warmth; radial pulse intact; sensation intact; equal bilateral grip strength with 5/5 strength with limited, resisted flexion extension  Lymphadenopathy:     Cervical: No cervical adenopathy.  Skin:    General: Skin is warm and dry.     Coloration: Skin is not pale.     Findings: No rash.  Neurological:     Mental Status: He is alert.     Coordination: Coordination normal.      ED Treatments / Results  Labs (all labs ordered are listed, but only abnormal results are displayed) Labs Reviewed - No data to display  EKG None  Radiology No results found.  Procedures Procedures (including critical care time)  Medications Ordered in ED Medications  ketorolac (TORADOL) 30 MG/ML injection 15 mg (has no administration in time range)     Initial Impression / Assessment and Plan / ED Course  I have reviewed the triage vital signs and the nursing notes.  Pertinent labs & imaging results that were available during my care of the patient were reviewed by me and considered in my medical decision making (see chart for details).        Patient presenting with left shoulder pain following fall.  Suspect rotator cuff injury, but x-ray is pending at shift change.  Neurovascularly intact.  Patient given IM Toradol for pain while he is waiting.  No history of kidney disease or GI bleeding, per patient. At shift change, care transferred to [facility], PA- C for determination of disposition.    Final Clinical Impressions(s) / ED Diagnoses   Final diagnoses:  Acute pain of left shoulder    ED Discharge Orders    None       Frederica Kuster, PA-C 03/23/19 1555    Milton Ferguson, MD 03/27/19 1310

## 2019-03-23 NOTE — Discharge Instructions (Signed)
Please read and follow all provided instructions.  Your diagnoses today include:  1. Acute pain of left shoulder   2. Sprain of left shoulder, unspecified shoulder sprain type, initial encounter     Tests performed today include:  An x-ray of the affected area - does NOT show any broken bones  Vital signs. See below for your results today.   Medications prescribed:   Naproxen - anti-inflammatory pain medication  Do not exceed 500mg  naproxen every 12 hours, take with food  You have been prescribed an anti-inflammatory medication or NSAID. Take with food. Take smallest effective dose for the shortest duration needed for your pain. Stop taking if you experience stomach pain or vomiting.    Robaxin (methocarbamol) - muscle relaxer medication  DO NOT drive or perform any activities that require you to be awake and alert because this medicine can make you drowsy.   Take any prescribed medications only as directed.  Home care instructions:   Follow any educational materials contained in this packet  Follow R.I.C.E. Protocol:  R - rest your injury   I  - use ice on injury without applying directly to skin  C - compress injury with bandage or splint  E - elevate the injury as much as possible  Follow-up instructions: Please follow-up with your primary care provider or the provided orthopedic physician (bone specialist) if you continue to have significant pain in 1 week. In this case you may have a more severe injury that requires further care.   Return instructions:   Please return if your fingers are numb or tingling, appear gray or blue, or you have severe pain (also elevate the arm and loosen splint or wrap if you were given one)  Please return to the Emergency Department if you experience worsening symptoms.   Please return if you have any other emergent concerns.  Additional Information:  Your vital signs today were: BP 138/79 (BP Location: Right Arm)    Pulse 88     Temp 97.8 F (36.6 C) (Oral)    Resp 14    Ht 6\' 2"  (1.88 m)    Wt 108 kg    SpO2 98%    BMI 30.56 kg/m  If your blood pressure (BP) was elevated above 135/85 this visit, please have this repeated by your doctor within one month. --------------

## 2019-03-23 NOTE — ED Provider Notes (Signed)
4:30 PM Handoff from Law PA-C at shift change.  Patient pending x-ray for left shoulder pain after a fall 2 days ago.  X-rays are negative.  Patient likely has a left shoulder sprain.  Conservative treatment for 1 week, orthopedic follow-up recommended if not improving after that time.  Upper extremity is neurovascularly intact.  Patient is tender about the posterior lateral shoulder.  He is unable to lift above shoulder height.  BP 128/84 (BP Location: Right Arm)   Pulse 67   Temp 97.8 F (36.6 C) (Oral)   Resp 16   Ht 6\' 2"  (1.88 m)   Wt 108 kg   SpO2 99%   BMI 30.56 kg/m   Patient counseled on proper use of muscle relaxant medication.  They were told not to drink alcohol, drive any vehicle, or do any dangerous activities while taking this medication.  Patient verbalized understanding.    Carlisle Cater, PA-C 03/23/19 1630    Carmin Muskrat, MD 03/24/19 [phone]

## 2019-03-23 NOTE — ED Triage Notes (Signed)
Pt reports he fell on Wednesday and today he is coming in for left shoulder pain.

## 2019-10-29 IMAGING — CR LEFT SHOULDER - 2+ VIEW
3 series · 3 of 3 positions shown · non-contrast
Comparison: None.

CLINICAL DATA: Left shoulder pain after fall 2 days ago.

EXAM:
LEFT SHOULDER - 2+ VIEW

[shoulder grashey]
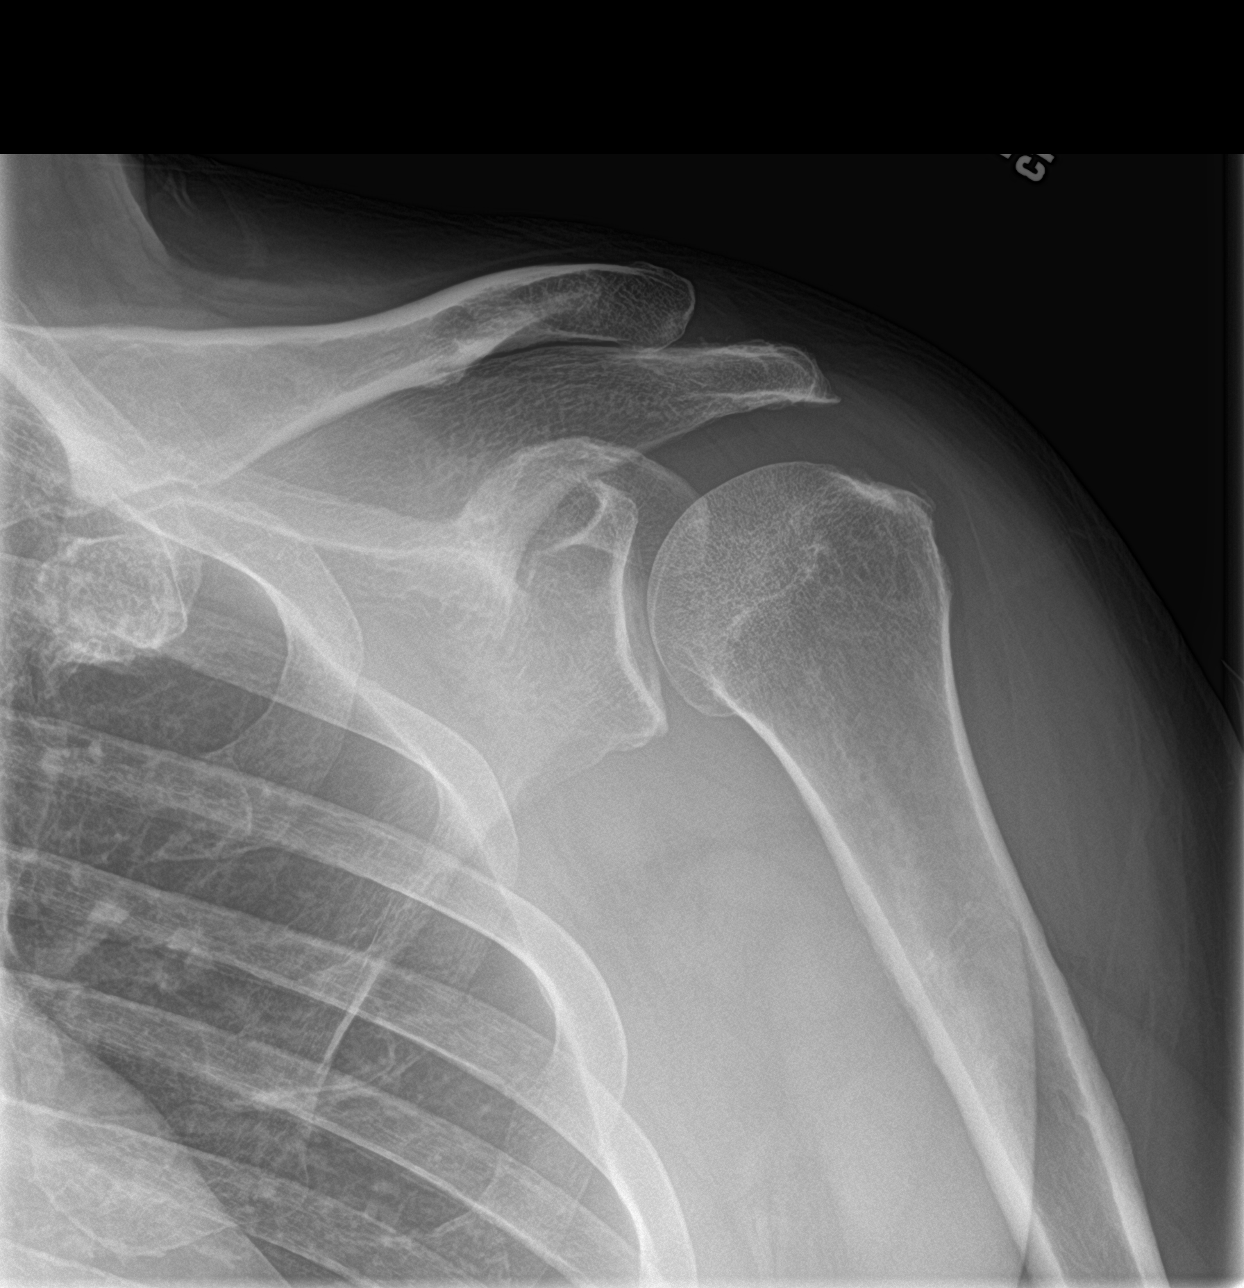

[shoulder y view]
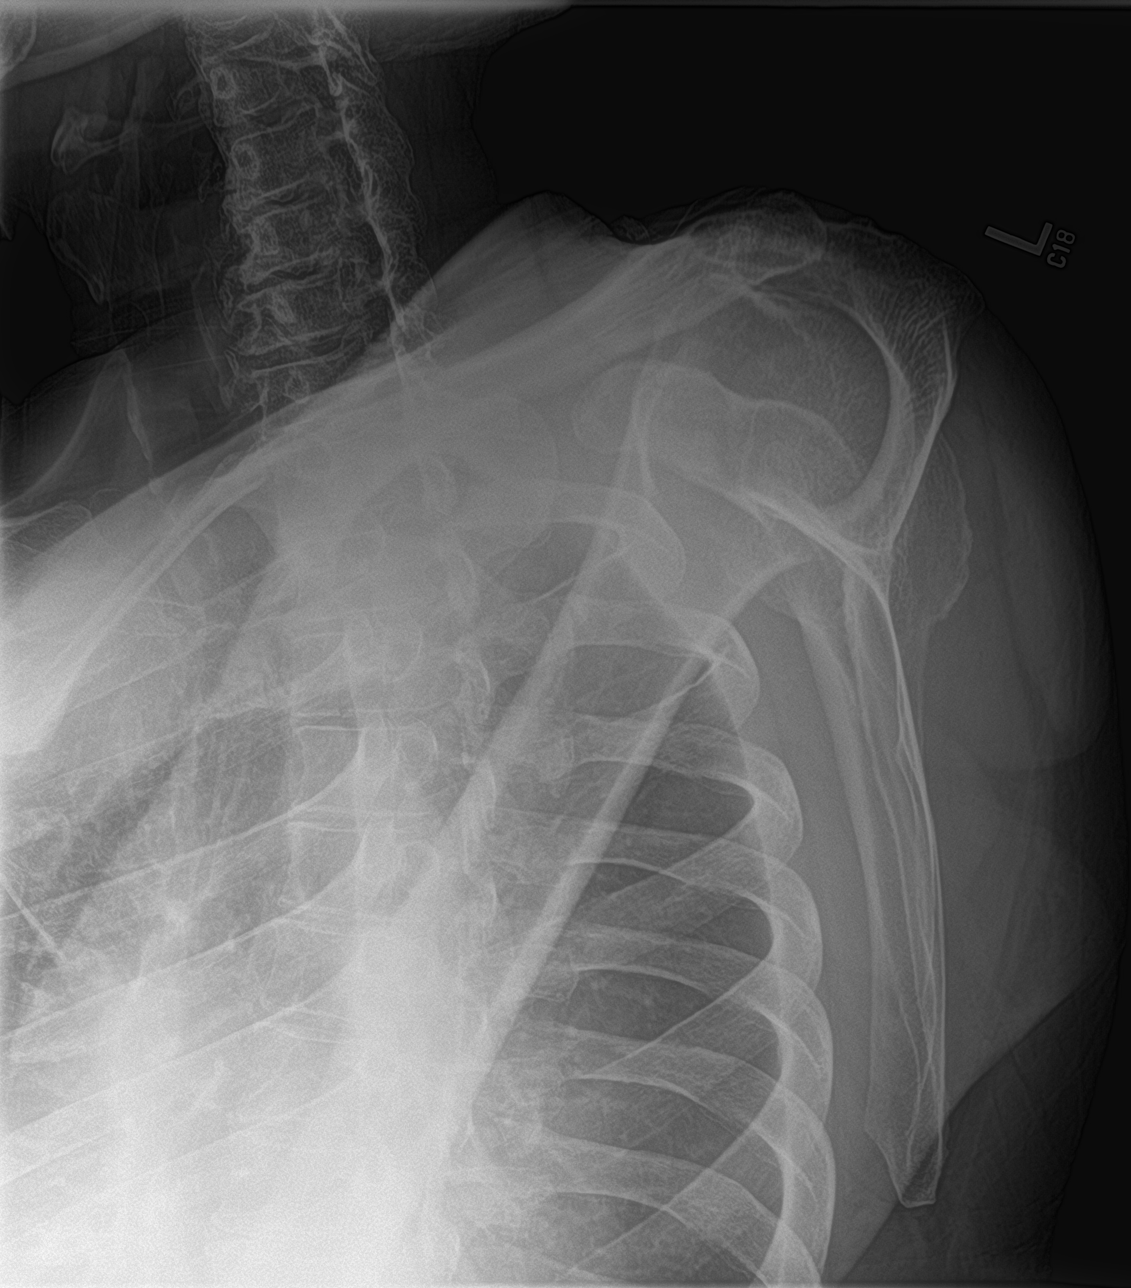

[shoulder axillary]
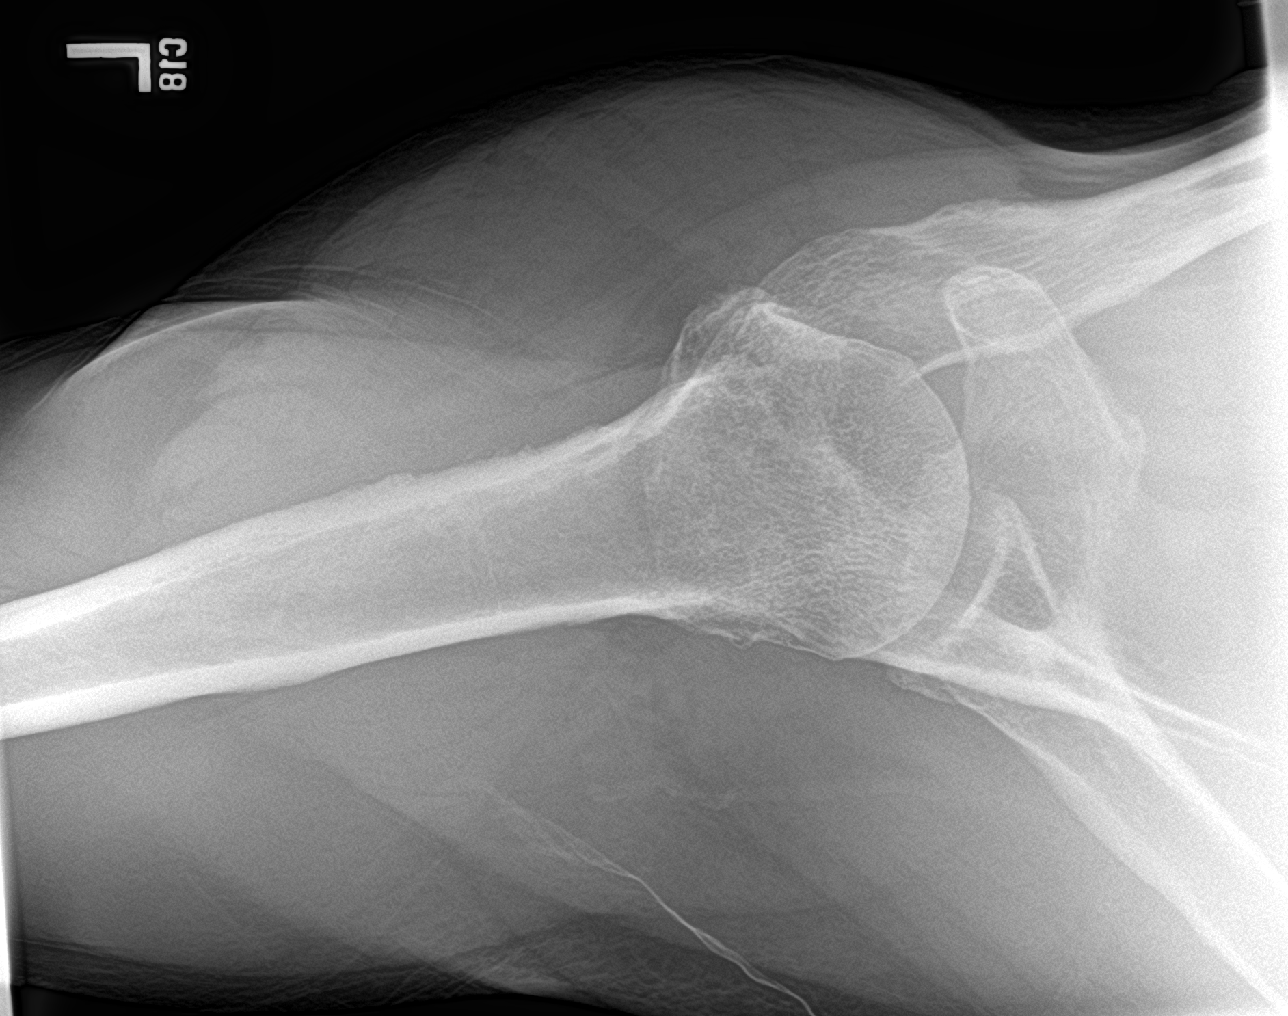

[3 of 3 positions shown; findings below may reference images not displayed]

FINDINGS: There is no evidence of fracture or dislocation. There is no
evidence of arthropathy or other focal bone abnormality. Soft
tissues are unremarkable.
IMPRESSION: Negative.

## 2019-12-02 ENCOUNTER — Other Ambulatory Visit: Payer: Self-pay

## 2019-12-02 DIAGNOSIS — Z20822 Contact with and (suspected) exposure to covid-19: Secondary | ICD-10-CM

## 2019-12-03 LAB — NOVEL CORONAVIRUS, NAA: SARS-CoV-2, NAA: NOT DETECTED

## 2019-12-07 ENCOUNTER — Telehealth: Payer: Self-pay | Admitting: General Practice

## 2019-12-07 NOTE — Telephone Encounter (Signed)
Midgett,Jaime [name] called to receive the patient's COVID test results. Patient expressed understanding.
